# Patient Record
Sex: Female | Born: 1990 | Race: Black or African American | Hispanic: No | Marital: Single | Smoking: Current some day smoker
Health system: Southern US, Community
[De-identification: ages and names within clinical notes are randomized; demographics above are authoritative.]

## PROBLEM LIST (undated history)

## (undated) DIAGNOSIS — N946 Dysmenorrhea, unspecified: Secondary | ICD-10-CM

## (undated) DIAGNOSIS — J45909 Unspecified asthma, uncomplicated: Secondary | ICD-10-CM

## (undated) DIAGNOSIS — I10 Essential (primary) hypertension: Secondary | ICD-10-CM

## (undated) DIAGNOSIS — Z5189 Encounter for other specified aftercare: Secondary | ICD-10-CM

## (undated) DIAGNOSIS — D219 Benign neoplasm of connective and other soft tissue, unspecified: Secondary | ICD-10-CM

## (undated) DIAGNOSIS — D649 Anemia, unspecified: Secondary | ICD-10-CM

## (undated) HISTORY — DX: Essential (primary) hypertension: I10

## (undated) HISTORY — DX: Encounter for other specified aftercare: Z51.89

## (undated) HISTORY — DX: Benign neoplasm of connective and other soft tissue, unspecified: D21.9

## (undated) HISTORY — DX: Dysmenorrhea, unspecified: N94.6

## (undated) HISTORY — DX: Unspecified asthma, uncomplicated: J45.909

## (undated) HISTORY — DX: Anemia, unspecified: D64.9

---

## 2016-01-06 DIAGNOSIS — N920 Excessive and frequent menstruation with regular cycle: Secondary | ICD-10-CM | POA: Insufficient documentation

## 2016-01-25 DIAGNOSIS — D250 Submucous leiomyoma of uterus: Secondary | ICD-10-CM | POA: Insufficient documentation

## 2016-03-28 DIAGNOSIS — G44219 Episodic tension-type headache, not intractable: Secondary | ICD-10-CM | POA: Insufficient documentation

## 2016-08-08 DIAGNOSIS — N840 Polyp of corpus uteri: Secondary | ICD-10-CM | POA: Insufficient documentation

## 2016-08-08 DIAGNOSIS — D500 Iron deficiency anemia secondary to blood loss (chronic): Secondary | ICD-10-CM | POA: Insufficient documentation

## 2020-02-12 ENCOUNTER — Encounter: Payer: Self-pay | Admitting: Obstetrics and Gynecology

## 2020-02-12 ENCOUNTER — Other Ambulatory Visit (HOSPITAL_COMMUNITY)
Admission: RE | Admit: 2020-02-12 | Discharge: 2020-02-12 | Disposition: A | Discharge status: Home / Self Care | Payer: PRIVATE HEALTH INSURANCE | Source: Ambulatory Visit | Attending: Obstetrics and Gynecology | Admitting: Obstetrics and Gynecology

## 2020-02-12 ENCOUNTER — Ambulatory Visit (INDEPENDENT_AMBULATORY_CARE_PROVIDER_SITE_OTHER): Payer: PRIVATE HEALTH INSURANCE | Admitting: Obstetrics and Gynecology

## 2020-02-12 ENCOUNTER — Other Ambulatory Visit: Payer: Self-pay

## 2020-02-12 VITALS — BP 142/88 | HR 102 | Ht 63.75 in | Wt 170.0 lb

## 2020-02-12 DIAGNOSIS — Z124 Encounter for screening for malignant neoplasm of cervix: Secondary | ICD-10-CM | POA: Diagnosis present

## 2020-02-12 DIAGNOSIS — Z3169 Encounter for other general counseling and advice on procreation: Secondary | ICD-10-CM

## 2020-02-12 DIAGNOSIS — Z23 Encounter for immunization: Secondary | ICD-10-CM

## 2020-02-12 DIAGNOSIS — Z01419 Encounter for gynecological examination (general) (routine) without abnormal findings: Secondary | ICD-10-CM

## 2020-02-12 DIAGNOSIS — Z833 Family history of diabetes mellitus: Secondary | ICD-10-CM

## 2020-02-12 DIAGNOSIS — Z113 Encounter for screening for infections with a predominantly sexual mode of transmission: Secondary | ICD-10-CM | POA: Insufficient documentation

## 2020-02-12 DIAGNOSIS — Z8619 Personal history of other infectious and parasitic diseases: Secondary | ICD-10-CM

## 2020-02-12 DIAGNOSIS — Z0000 Encounter for general adult medical examination without abnormal findings: Secondary | ICD-10-CM

## 2020-02-12 NOTE — Patient Instructions (Addendum)
Semen Analysis Test Why am I having this test? A semen analysis test is done to check certain aspects of the health of a man's reproductive organs (testes) and the hormone system that plays a role in semen production. Semen is a whitish secretion that is released from the penis during the final phase of orgasm (ejaculation). It is made up of liquids and nutrients from the prostate gland, seminal vesicles, and other glands. It also contains sperm cells from the testes. A single sperm cell contains one complete set of a man's genetic coding (chromosomes). You may have this test as a part of infertility testing, which is done to help find out reasons for the inability to have a child. The test may also be done to determine whether a previously performed vasectomy was successful. A vasectomy is a procedure done to make a man permanently infertile by tying the tube (the vas deferens) that collects the sperm from the testicle. A vasectomy blocks the sperm from going through the vas deferens and penis so that the sperm will not go into the vagina during sexual intercourse. What is being tested? If the test is done as part of infertility testing, the shape (morphology), size, and movement (motility) of sperm cells will be included in the analysis. This testing may also include assessing a sperm cell's ability to penetrate an egg (fertilize) as well as the formation of genetic material (DNA). If the test is done to check the success of a vasectomy, the sample will be checked for the presence of sperm. What kind of sample is taken? A semen sample is required for this test. A semen sample will be collected by ejaculation into a sterile glass or plastic container provided by the lab. This can be done at home, in your health care provider's office, or in the lab. How do I collect samples at home?  If the sample will be collected at home, follow your health care provider's instructions about how to collect the sample.  The sample should be delivered to the lab within 1 hour after collection. It should also be protected from extreme heat or cold. How do I prepare for this test? For infertility testing: Avoid sexual activity for 2-3 days before the semen sample collection. However, do not avoid ejaculation for a prolonged period because this can alter the motility of sperm cells. For vasectomy success testing: Make sure that you ejaculate one or two times before the day of semen sample collection. This will clear the vas deferens of any sperm that were present before the vasectomy was performed. Tell a health care provider about:  All medicines you are taking, including vitamins, herbs, eye drops, creams, and over-the-counter medicines. How are the results reported? Your test results will be reported as values. Your health care provider will compare your results to normal ranges that were established after testing a large group of people (reference values). Reference values may vary among labs and hospitals. For this test, common reference values are:  Volume: 2-5 mL.  Liquefaction time: 20-30 minutes after collection.  Appearance: normal (whitish in color).  Motile/mL: greater than or equal to 10 million.  Sperm/mL: greater than or equal to 20 million.  Viscosity: greater than or equal to 3.  Agglutination: greater than or equal to 3.  Supravital: greater than or equal to 75% live.  Fructose: positive.  pH: 7.12-8.  Sperm count (density): greater than or equal to 20 million/mL.  Sperm motility: greater than or equal to 50%  at 1 hour.  Sperm morphology: greater than 30% Tyrell Antonio criteria greater than 14%) normally shaped. What do the results mean? Low sperm count, abnormal motility, or abnormal morphology of sperm cells may all cause problems with female fertility. Abnormal results can also be caused by:  Certain infectious diseases, such as inflammation of a testis (orchitis) from  mumps.  Receiving certain kinds of toxic drugs, such as chemotherapy.  Having testicles that did not develop normally in childhood. When the semen analysis test is done to check the success of a vasectomy, the presence of sperm may mean that the surgery was not successful. Your health care provider may suggest a repeat of this test. Talk with your health care provider about what your results mean. Questions to ask your health care provider Ask your health care provider, or the department that is doing the test:  When will my results be ready?  How will I get my results?  What are my treatment options?  What other tests do I need?  What are my next steps? Summary  A semen analysis test is done to check certain aspects of the health of a man's reproductive organs (testes) and the hormone system that plays a role in semen production.  You may have this test as a part of infertility testing, which is done to help find out reasons for the inability to have a child. The test may also be done to determine whether a previously performed vasectomy was successful.  A semen sample will be collected by ejaculation into a sterile glass or plastic container. Follow instructions about how to collect the sample, and make sure you deliver the sample to the lab within 1 hour of obtaining it.  Talk with your health care provider about what your results mean. This information is not intended to replace advice given to you by your health care provider. Make sure you discuss any questions you have with your health care provider. Document Revised: 02/15/2017 Document Reviewed: 02/15/2017 Elsevier Patient Education  [place]. Hysterosalpingography  Hysterosalpingography is a procedure in which a woman's uterus and fallopian tubes are examined. During this procedure, contrast dye is injected into the uterus through the vagina and cervix. X-rays are then taken. The dye makes the uterus and fallopian  tubes show up clearly on the X-rays. This procedure may be done:  To help determine whether there are tumors, scars (adhesions), or other abnormalities in the uterus.  To find out why a woman is unable to have children (infertile).  To make sure the fallopian tubes are completely blocked a few months after having certain tubal sterilization procedures. Tell a health care provider about:  Any allergies you have.  All medicines you are taking, including vitamins, herbs, eye drops, creams, and over-the-counter medicines.  Any problems you or family members have had with the use of anesthetic medicines.  Any blood disorders you have.  Any surgeries you have had.  Any medical conditions you have.  Whether you are pregnant or may be pregnant. What are the risks? Generally, this is a safe procedure. However, problems may occur, including:  Infection in the lining of the uterus (endometritis) or fallopian tubes (salpingitis).  Allergic reaction to medicines or dyes.  Risk of making a hole (perforation) in the uterus or fallopian tubes.  Damage to other structures or organs. What happens before the procedure?  Schedule the procedure after your menstrual period stops, but before your next ovulation. This is usually between day 5  and day 10 of your last period. Day 1 is the first day of your period.  Ask your health care provider about changing or stopping your regular medicines. This is especially important if you are taking diabetes medicines or blood thinners.  Empty your bladder before the procedure begins.  Plan to have someone take you home from the hospital or clinic. What happens during the procedure?  You may be given one of the following: ? A medicine to help you relax (sedative). ? An over-the-counter pain medicine.  You will lie down on your back and place your feet into footrests (stirrups).  A device called a speculum will be inserted into your vagina. This allows  your health care provider to see inside your vagina through to your cervix.  Your cervix will be washed with a germ-killing soap.  A medicine may be injected into your cervix to numb it (local anesthesia).  A thin, flexible tube will be passed through your cervix into your uterus.  Contrast dye will be passed through the tube and into the uterus. Contrast dye may cause some cramping.  Several X-rays will be taken as the contrast dye spreads through the uterus and into the fallopian tubes.  The tube will be removed. The contrast dye will flow out through your vagina naturally. The procedure may vary among health care providers and hospitals. What happens after the procedure?  Most of the contrast dye will flow out of your vagina naturally. You may want to wear a sanitary pad.  You may have mild cramping and vaginal bleeding. This should go away after a short time.  Do not drive for 24 hours if you were given a sedative.  It is up to you to get the results of your procedure. Ask your health care provider, or the department that is doing the procedure, when your results will be ready. Summary  Hysterosalpingography is a procedure in which a woman's uterus and fallopian tubes are examined.  During this procedure, contrast dye is injected into the uterus through the vagina and cervix. X-rays are then taken. The dye helps the uterus and fallopian tubes show up clearly on the X-rays.  Schedule the procedure after your menstrual period stops, but before your next ovulation. This is usually between day 5 and day 10 of your last period.  After the procedure, you may have mild cramping and vaginal bleeding. This should go away after a short time. This information is not intended to replace advice given to you by your health care provider. Make sure you discuss any questions you have with your health care provider. Document Revised: 05/04/2017 Document Reviewed: 06/14/2016 Elsevier Patient  Education  2020 [employer]. Preparing for Pregnancy If you are considering becoming pregnant, make an appointment to see your regular health care provider to learn how to prepare for a safe and healthy pregnancy (preconception care). During a preconception care visit, your health care provider will:  Do a complete physical exam, including a Pap test.  Take a complete medical history.  Give you information, answer your questions, and help you resolve problems. Preconception checklist Medical history  Tell your health care provider about any current or past medical conditions. Your pregnancy or your ability to become pregnant may be affected by chronic conditions, such as diabetes, chronic hypertension, and thyroid problems.  Include your family's medical history as well as your partner's medical history.  Tell your health care provider about any history of STIs (sexually transmitted infections).These can  affect your pregnancy. In some cases, they can be passed to your baby. Discuss any concerns that you have about STIs.  If indicated, discuss the benefits of genetic testing. This testing will show whether there are any genetic conditions that may be passed from you or your partner to your baby.  Tell your health care provider about: ? Any problems you have had with conception or pregnancy. ? Any medicines you take. These include vitamins, herbal supplements, and over-the-counter medicines. ? Your history of immunizations. Discuss any vaccinations that you may need. Diet  Ask your health care provider what to include in a healthy diet that has a balance of nutrients. This is especially important when you are pregnant or preparing to become pregnant.  Ask your health care provider to help you reach a healthy weight before pregnancy. ? If you are overweight, you may be at higher risk for certain complications, such as high blood pressure, diabetes, and preterm birth. ? If you are  underweight, you are more likely to have a baby who has a low birth weight. Lifestyle, work, and home  Let your health care provider know: ? About any lifestyle habits that you have, such as alcohol use, drug use, or smoking. ? About recreational activities that may put you at risk during pregnancy, such as downhill skiing and certain exercise programs. ? Tell your health care provider about any international travel, especially any travel to places with an active [place] virus outbreak. ? About harmful substances that you may be exposed to at work or at home. These include chemicals, pesticides, radiation, or even litter boxes. ? If you do not feel safe at home. Mental health  Tell your health care provider about: ? Any history of mental health conditions, including feelings of depression, sadness, or anxiety. ? Any medicines that you take for a mental health condition. These include herbs and supplements. Home instructions to prepare for pregnancy Lifestyle   Eat a balanced diet. This includes fresh fruits and vegetables, whole grains, lean meats, low-fat dairy products, healthy fats, and foods that are high in fiber. Ask to meet with a nutritionist or registered dietitian for assistance with meal planning and goals.  Get regular exercise. Try to be active for at least 30 minutes a day on most days of the week. Ask your health care provider which activities are safe during pregnancy.  Do not use any products that contain nicotine or tobacco, such as cigarettes and e-cigarettes. If you need help quitting, ask your health care provider.  Do not drink alcohol.  Do not take illegal drugs.  Maintain a healthy weight. Ask your health care provider what weight range is right for you. General instructions  Keep an accurate record of your menstrual periods. This makes it easier for your health care provider to determine your baby's due date.  Begin taking prenatal vitamins and folic acid  supplements daily as directed by your health care provider.  Manage any chronic conditions, such as high blood pressure and diabetes, as told by your health care provider. This is important. How do I know that I am pregnant? You may be pregnant if you have been sexually active and you miss your period. Symptoms of early pregnancy include:  Mild cramping.  Very light vaginal bleeding (spotting).  Feeling unusually tired.  Nausea and vomiting (morning sickness). If you have any of these symptoms and you suspect that you might be pregnant, you can take a home pregnancy test. These tests check  for a hormone in your urine (human chorionic gonadotropin, or hCG). A woman's body begins to make this hormone during early pregnancy. These tests are very accurate. Wait until at least the first day after you miss your period to take one. If the test shows that you are pregnant (you get a positive result), call your health care provider to make an appointment for prenatal care. What should I do if I become pregnant?      Make an appointment with your health care provider as soon as you suspect you are pregnant.  Do not use any products that contain nicotine, such as cigarettes, chewing tobacco, and e-cigarettes. If you need help quitting, ask your health care provider.  Do not drink alcoholic beverages. Alcohol is related to a number of birth defects.  Avoid toxic odors and chemicals.  You may continue to have sexual intercourse if it does not cause pain or other problems, such as vaginal bleeding. This information is not intended to replace advice given to you by your health care provider. Make sure you discuss any questions you have with your health care provider. Document Revised: 05/24/2017 Document Reviewed: 12/12/2015 Elsevier Patient Education  [place] DIET:  We recommended that you start or continue a regular exercise program for good health. Regular exercise  means any activity that makes your heart beat faster and makes you sweat.  We recommend exercising at least 30 minutes per day at least 3 days a week, preferably 4 or 5.  We also recommend a diet low in fat and sugar.  Inactivity, poor dietary choices and obesity can cause diabetes, heart attack, stroke, and kidney damage, among others.    ALCOHOL AND SMOKING:  Women should limit their alcohol intake to no more than 7 drinks/beers/glasses of wine (combined, not each!) per week. Moderation of alcohol intake to this level decreases your risk of breast cancer and liver damage. And of course, no recreational drugs are part of a healthy lifestyle.  And absolutely no smoking or even second hand smoke. Most people know smoking can cause heart and lung diseases, but did you know it also contributes to weakening of your bones? Aging of your skin?  Yellowing of your teeth and nails?  CALCIUM AND VITAMIN D:  Adequate intake of calcium and Vitamin D are recommended.  The recommendations for exact amounts of these supplements seem to change often, but generally speaking 1,000 mg of calcium (between diet and supplement) and 800 units of Vitamin D per day seems prudent. Certain women may benefit from higher intake of Vitamin D.  If you are among these women, your doctor will have told you during your visit.    PAP SMEARS:  Pap smears, to check for cervical cancer or precancers,  have traditionally been done yearly, although recent scientific advances have shown that most women can have pap smears less often.  However, every woman still should have a physical exam from her gynecologist every year. It will include a breast check, inspection of the vulva and vagina to check for abnormal growths or skin changes, a visual exam of the cervix, and then an exam to evaluate the size and shape of the uterus and ovaries.  And after 30 years of age, a rectal exam is indicated to check for rectal cancers. We will also provide age  appropriate advice regarding health maintenance, like when you should have certain vaccines, screening for sexually transmitted diseases, bone density testing, colonoscopy, mammograms,  etc.   MAMMOGRAMS:  All women over 58 years old should have a yearly mammogram. Many facilities now offer a "3D" mammogram, which may cost around $50 extra out of pocket. If possible,  we recommend you accept the option to have the 3D mammogram performed.  It both reduces the number of women who will be called back for extra views which then turn out to be normal, and it is better than the routine mammogram at detecting truly abnormal areas.    COLON CANCER SCREENING: Now recommend starting at age 65. At this time colonoscopy is not covered for routine screening until 50. There are take home tests that can be done between 45-49.   COLONOSCOPY:  Colonoscopy to screen for colon cancer is recommended for all women at age 64.  We know, you hate the idea of the prep.  We agree, BUT, having colon cancer and not knowing it is worse!!  Colon cancer so often starts as a polyp that can be seen and removed at colonscopy, which can quite literally save your life!  And if your first colonoscopy is normal and you have no family history of colon cancer, most women don't have to have it again for 10 years.  Once every ten years, you can do something that may end up saving your life, right?  We will be happy to help you get it scheduled when you are ready.  Be sure to check your insurance coverage so you understand how much it will cost.  It may be covered as a preventative service at no cost, but you should check your particular policy.      Breast Self-Awareness Breast self-awareness means being familiar with how your breasts look and feel. It involves checking your breasts regularly and reporting any changes to your health care provider. Practicing breast self-awareness is important. A change in your breasts can be a sign of a serious  medical problem. Being familiar with how your breasts look and feel allows you to find any problems early, when treatment is more likely to be successful. All women should practice breast self-awareness, including women who have had breast implants. How to do a breast self-exam One way to learn what is normal for your breasts and whether your breasts are changing is to do a breast self-exam. To do a breast self-exam: Look for Changes  1. Remove all the clothing above your waist. 2. Stand in front of a mirror in a room with good lighting. 3. Put your hands on your hips. 4. Push your hands firmly downward. 5. Compare your breasts in the mirror. Look for differences between them (asymmetry), such as: ? Differences in shape. ? Differences in size. ? Puckers, dips, and bumps in one breast and not the other. 6. Look at each breast for changes in your skin, such as: ? Redness. ? Scaly areas. 7. Look for changes in your nipples, such as: ? Discharge. ? Bleeding. ? Dimpling. ? Redness. ? A change in position. Feel for Changes Carefully feel your breasts for lumps and changes. It is best to do this while lying on your back on the floor and again while sitting or standing in the shower or tub with soapy water on your skin. Feel each breast in the following way:  Place the arm on the side of the breast you are examining above your head.  Feel your breast with the other hand.  Start in the nipple area and make  inch (2 cm) overlapping  circles to feel your breast. Use the pads of your three middle fingers to do this. Apply light pressure, then medium pressure, then firm pressure. The light pressure will allow you to feel the tissue closest to the skin. The medium pressure will allow you to feel the tissue that is a little deeper. The firm pressure will allow you to feel the tissue close to the ribs.  Continue the overlapping circles, moving downward over the breast until you feel your ribs below  your breast.  Move one finger-width toward the center of the body. Continue to use the  inch (2 cm) overlapping circles to feel your breast as you move slowly up toward your collarbone.  Continue the up and down exam using all three pressures until you reach your armpit.  Write Down What You Find  Write down what is normal for each breast and any changes that you find. Keep a written record with breast changes or normal findings for each breast. By writing this information down, you do not need to depend only on memory for size, tenderness, or location. Write down where you are in your menstrual cycle, if you are still menstruating. If you are having trouble noticing differences in your breasts, do not get discouraged. With time you will become more familiar with the variations in your breasts and more comfortable with the exam. How often should I examine my breasts? Examine your breasts every month. If you are breastfeeding, the best time to examine your breasts is after a feeding or after using a breast pump. If you menstruate, the best time to examine your breasts is 5-7 days after your period is over. During your period, your breasts are lumpier, and it may be more difficult to notice changes. When should I see my health care provider? See your health care provider if you notice:  A change in shape or size of your breasts or nipples.  A change in the skin of your breast or nipples, such as a reddened or scaly area.  Unusual discharge from your nipples.  A lump or thick area that was not there before.  Pain in your breasts.  Anything that concerns you.     Hysterosalpingography  Hysterosalpingography is a procedure in which a woman's uterus and fallopian tubes are examined. During this procedure, contrast dye is injected into the uterus through the vagina and cervix. X-rays are then taken. The dye makes the uterus and fallopian tubes show up clearly on the X-rays. This procedure  may be done:  To help determine whether there are tumors, scars (adhesions), or other abnormalities in the uterus.  To find out why a woman is unable to have children (infertile).  To make sure the fallopian tubes are completely blocked a few months after having certain tubal sterilization procedures. Tell a health care provider about:  Any allergies you have.  All medicines you are taking, including vitamins, herbs, eye drops, creams, and over-the-counter medicines.  Any problems you or family members have had with the use of anesthetic medicines.  Any blood disorders you have.  Any surgeries you have had.  Any medical conditions you have.  Whether you are pregnant or may be pregnant. What are the risks? Generally, this is a safe procedure. However, problems may occur, including:  Infection in the lining of the uterus (endometritis) or fallopian tubes (salpingitis).  Allergic reaction to medicines or dyes.  Risk of making a hole (perforation) in the uterus or fallopian tubes.  Damage  to other structures or organs. What happens before the procedure?  Schedule the procedure after your menstrual period stops, but before your next ovulation. This is usually between day 5 and day 10 of your last period. Day 1 is the first day of your period.  Ask your health care provider about changing or stopping your regular medicines. This is especially important if you are taking diabetes medicines or blood thinners.  Empty your bladder before the procedure begins.  Plan to have someone take you home from the hospital or clinic. What happens during the procedure?  You may be given one of the following: ? A medicine to help you relax (sedative). ? An over-the-counter pain medicine.  You will lie down on your back and place your feet into footrests (stirrups).  A device called a speculum will be inserted into your vagina. This allows your health care provider to see inside your vagina  through to your cervix.  Your cervix will be washed with a germ-killing soap.  A medicine may be injected into your cervix to numb it (local anesthesia).  A thin, flexible tube will be passed through your cervix into your uterus.  Contrast dye will be passed through the tube and into the uterus. Contrast dye may cause some cramping.  Several X-rays will be taken as the contrast dye spreads through the uterus and into the fallopian tubes.  The tube will be removed. The contrast dye will flow out through your vagina naturally. The procedure may vary among health care providers and hospitals. What happens after the procedure?  Most of the contrast dye will flow out of your vagina naturally. You may want to wear a sanitary pad.  You may have mild cramping and vaginal bleeding. This should go away after a short time.  Do not drive for 24 hours if you were given a sedative.  It is up to you to get the results of your procedure. Ask your health care provider, or the department that is doing the procedure, when your results will be ready. Summary  Hysterosalpingography is a procedure in which a woman's uterus and fallopian tubes are examined.  During this procedure, contrast dye is injected into the uterus through the vagina and cervix. X-rays are then taken. The dye helps the uterus and fallopian tubes show up clearly on the X-rays.  Schedule the procedure after your menstrual period stops, but before your next ovulation. This is usually between day 5 and day 10 of your last period.  After the procedure, you may have mild cramping and vaginal bleeding. This should go away after a short time. This information is not intended to replace advice given to you by your health care provider. Make sure you discuss any questions you have with your health care provider. Document Revised: 05/04/2017 Document Reviewed: 06/14/2016 Elsevier Patient Education  [place].  Breast  Tenderness Breast tenderness is a common problem for women of all ages, but may also occur in men. Breast tenderness may range from mild discomfort to severe pain. In women, the pain usually comes and goes with the menstrual cycle, but it can also be constant. Breast tenderness has many possible causes, including hormone changes, infections, and taking certain medicines. You may have tests, such as a mammogram or an ultrasound, to check for any unusual findings. Having breast tenderness usually does not mean that you have breast cancer. Follow these instructions at home: Managing pain and discomfort   If directed, put ice to  the painful area. To do this: ? Put ice in a plastic bag. ? Place a towel between your skin and the bag. ? Leave the ice on for 20 minutes, 2-3 times a day.  Wear a supportive bra, especially during exercise. You may also want to wear a supportive bra while sleeping if your breasts are very tender. Medicines  Take over-the-counter and prescription medicines only as told by your health care provider. If the cause of your pain is infection, you may be prescribed an antibiotic medicine.  If you were prescribed an antibiotic, take it as told by your health care provider. Do not stop taking the antibiotic even if you start to feel better. Eating and drinking  Your health care provider may recommend that you lessen the amount of fat in your diet. You can do this by: ? Limiting fried foods. ? Cooking foods using methods such as baking, boiling, grilling, and broiling.  Decrease the amount of caffeine in your diet. Instead, drink more water and choose caffeine-free drinks. General instructions   Keep a log of the days and times when your breasts are most tender.  Ask your health care provider how to do breast exams at home. This will help you notice if you have an unusual growth or lump.  Keep all follow-up visits as told by your health care provider. This is  important. Contact a health care provider if:  Any part of your breast is hard, red, and hot to the touch. This may be a sign of infection.  You are a woman and: ? Not breastfeeding and you have fluid, especially blood or pus, coming out of your nipples. ? Have a new or painful lump in your breast that remains after your menstrual period ends.  You have a fever.  Your pain does not improve or it gets worse.  Your pain is interfering with your daily activities. Summary  Breast tenderness may range from mild discomfort to severe pain.  Breast tenderness has many possible causes, including hormone changes, infections, and taking certain medicines.  It can be treated with ice, wearing a supportive bra, and medicines.  Make changes to your diet if told to by your health care provider. This information is not intended to replace advice given to you by your health care provider. Make sure you discuss any questions you have with your health care provider. Document Revised: 10/14/2018 Document Reviewed: 10/14/2018 Elsevier Patient Education  [place].

## 2020-02-12 NOTE — Progress Notes (Signed)
29 y.o. No obstetric history on file. Single Black or African American Not Hispanic or Latino female here for annual exam. She had surgery in 2018 to remove fibroids, hysteroscopic myomectomy. She was having such heavy bleeding that she had to have a blood transfusion. Cycles have been fine since then. Patient states that her right breast is sore and tender for the last week. She states that she is trying to get pregnant, has been trying to get pregnant since 10/20. Having sex every 1-2 days if she isn't on her cycle.  She has h/o chlamydia.   She has a h/o HTN, recently her primary changed her from lisinopril to norvasc secondary to trying to conceive.   Period Cycle (Days): 25 Period Duration (Days): 6 Period Pattern: Regular Menstrual Flow: Moderate Menstrual Control: Tampon, Maxi pad Menstrual Control Change Freq (Hours): 2-3 Dysmenorrhea: (!) Mild Dysmenorrhea Symptoms: Nausea  Patient's last menstrual period was 02/08/2020 (exact date).          Sexually active:  Yes  The current method of family planning is none.    Exercising: No.  The patient does not participate in regular exercise at present. Smoker:  Yes, 1 cigarette in the evening.   Health Maintenance: Pap:  Aug 2020 Normal  History of abnormal Pap:  no MMG:  Never  BMD:   Never Colonoscopy: Never TDaP:  Unsure  Gardasil: complete  She had chicken pox as a kid.   reports that she has been smoking cigarettes. She has never used smokeless tobacco. She reports that she does not use drugs. She and her partner just moved here from [place].   Past Medical History:  Diagnosis Date  . Anemia   . Blood transfusion without reported diagnosis   . Dysmenorrhea   . Fibroid     Current Outpatient Medications  Medication Sig Dispense Refill  . amLODipine (NORVASC) 10 MG tablet Take 1 tablet by mouth daily.    . ferrous sulfate 325 (65 FE) MG tablet Take by mouth.     No current facility-administered medications for  this visit.    Family History  Problem Relation Age of Onset  . Diabetes Mother   . Hypertension Mother   . Diabetes Maternal Aunt   . Seizures Maternal Aunt   . Thyroid disease Maternal Aunt   . Osteoarthritis Maternal Grandmother   . Diabetes Maternal Grandmother   . Heart failure Maternal Grandmother   . Hypertension Maternal Grandmother   . Seizures Maternal Grandmother     Review of Systems  Genitourinary:       Breast pain     Exam:   BP (!) 160/100   Pulse (!) 102   Ht 5' 3.75" (1.619 m)   Wt 170 lb (77.1 kg)   LMP 02/08/2020 (Exact Date)   SpO2 99%   BMI 29.41 kg/m   Weight change: @WEIGHTCHANGE @ Height:   Height: 5' 3.75" (161.9 cm)  Ht Readings from Last 3 Encounters:  02/12/20 5' 3.75" (1.619 m)    General appearance: alert, cooperative and appears stated age Head: Normocephalic, without obvious abnormality, atraumatic Neck: no adenopathy, supple, symmetrical, trachea midline and thyroid normal to inspection and palpation Lungs: clear to auscultation bilaterally Cardiovascular: regular rate and rhythm Breasts: normal appearance, no masses or tenderness Abdomen: soft, non-tender; non distended,  no masses,  no organomegaly Extremities: extremities normal, atraumatic, no cyanosis or edema Skin: Skin color, texture, turgor normal. No rashes or lesions Lymph nodes: Cervical, supraclavicular, and axillary nodes normal. No  abnormal inguinal nodes palpated Neurologic: Grossly normal   Pelvic: External genitalia:  no lesions              Urethra:  normal appearing urethra with no masses, tenderness or lesions              Bartholins and Skenes: normal                 Vagina: normal appearing vagina with normal color and discharge, no lesions              Cervix: no lesions               Bimanual Exam:  Uterus:  anteverted, mobile, mildly tender, not appreciably enlarged              Adnexa: no mass, fullness, tenderness               Rectovaginal:  Confirms               Anus:  normal sphincter tone, no lesions  Gae Dry chaperoned for the exam.  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   H/O chlamydia  H/O HTN, elevated BP today  Focal breast tenderness in the last week  P:   Get Semen analysis, paperwork given for her partner to sign  HSG, she will call on the first day of her cycle  Cycle day #3 FSH, estradiol, call with the start of her cycle  Screening labs, Rubella titer  TDAP today  Start PNV  Discussed managing breast tenderness, if symptoms don't improve in the next few weeks she will call  Recheck BP 142/88  Name of primary given, she will discuss BP medication there

## 2020-02-13 ENCOUNTER — Encounter: Payer: Self-pay | Admitting: Obstetrics and Gynecology

## 2020-02-13 LAB — CBC
Hematocrit: 43.5 % (ref 34.0–46.6)
Hemoglobin: 14.5 g/dL (ref 11.1–15.9)
MCH: 28.4 pg (ref 26.6–33.0)
MCHC: 33.3 g/dL (ref 31.5–35.7)
MCV: 85 fL (ref 79–97)
Platelets: 217 10*3/uL (ref 150–450)
RBC: 5.11 x10E6/uL (ref 3.77–5.28)
RDW: 12.9 % (ref 11.7–15.4)
WBC: 4.4 10*3/uL (ref 3.4–10.8)

## 2020-02-13 LAB — COMPREHENSIVE METABOLIC PANEL
ALT: 65 IU/L — ABNORMAL HIGH (ref 0–32)
AST: 46 IU/L — ABNORMAL HIGH (ref 0–40)
Albumin/Globulin Ratio: 1.2 (ref 1.2–2.2)
Albumin: 4.8 g/dL (ref 3.9–5.0)
Alkaline Phosphatase: 85 IU/L (ref 48–121)
BUN/Creatinine Ratio: 17 (ref 9–23)
BUN: 12 mg/dL (ref 6–20)
Bilirubin Total: 0.5 mg/dL (ref 0.0–1.2)
CO2: 23 mmol/L (ref 20–29)
Calcium: 10.1 mg/dL (ref 8.7–10.2)
Chloride: 98 mmol/L (ref 96–106)
Creatinine, Ser: 0.71 mg/dL (ref 0.57–1.00)
GFR calc Af Amer: 132 mL/min/{1.73_m2} (ref >59)
GFR calc non Af Amer: 115 mL/min/{1.73_m2} (ref >59)
Globulin, Total: 4 g/dL (ref 1.5–4.5)
Glucose: 83 mg/dL (ref 65–99)
Potassium: 3.3 mmol/L — ABNORMAL LOW (ref 3.5–5.2)
Sodium: 137 mmol/L (ref 134–144)
Total Protein: 8.8 g/dL — ABNORMAL HIGH (ref 6.0–8.5)

## 2020-02-13 LAB — HEP, RPR, HIV PANEL
HIV Screen 4th Generation wRfx: NONREACTIVE
Hepatitis B Surface Ag: NEGATIVE
RPR Ser Ql: NONREACTIVE

## 2020-02-13 LAB — HEPATITIS C ANTIBODY: Hep C Virus Ab: <0.1 s/co ratio (ref 0.0–0.9)

## 2020-02-13 LAB — LIPID PANEL
Chol/HDL Ratio: 4.1 ratio (ref 0.0–4.4)
Cholesterol, Total: 231 mg/dL — ABNORMAL HIGH (ref 100–199)
HDL: 57 mg/dL (ref >39)
LDL Chol Calc (NIH): 152 mg/dL — ABNORMAL HIGH (ref 0–99)
Triglycerides: 122 mg/dL (ref 0–149)
VLDL Cholesterol Cal: 22 mg/dL (ref 5–40)

## 2020-02-13 LAB — HEMOGLOBIN A1C
Est. average glucose Bld gHb Est-mCnc: 100 mg/dL
Hgb A1c MFr Bld: 5.1 % (ref 4.8–5.6)

## 2020-02-13 LAB — RUBELLA SCREEN: Rubella Antibodies, IGG: 2.52 index (ref >0.99)

## 2020-02-16 ENCOUNTER — Encounter: Payer: Self-pay | Admitting: Obstetrics and Gynecology

## 2020-02-16 LAB — CYTOLOGY - PAP
Chlamydia: NEGATIVE
Comment: NEGATIVE
Comment: NEGATIVE
Comment: NEGATIVE
Comment: NORMAL
Diagnosis: NEGATIVE
High risk HPV: NEGATIVE
Neisseria Gonorrhea: NEGATIVE
Trichomonas: NEGATIVE

## 2020-02-17 ENCOUNTER — Encounter: Payer: Self-pay | Admitting: Obstetrics and Gynecology

## 2020-02-17 ENCOUNTER — Telehealth: Payer: Self-pay

## 2020-02-17 NOTE — Telephone Encounter (Signed)
Pt sent following mychart message:   [place], [facility] What's does that mean when it say transformation zone present on my pap results

## 2020-02-17 NOTE — Telephone Encounter (Signed)
Pap results from 02/12/20.   Routing to Dr Talbert Nan for review and recommendations. Please advise.

## 2020-02-18 ENCOUNTER — Encounter: Payer: Self-pay | Admitting: Obstetrics and Gynecology

## 2020-02-18 NOTE — Telephone Encounter (Signed)
Spoke with Elizabeth Morales. Elizabeth Morales verbalized understanding of lab results. Elizabeth Morales wanting to know next steps for infertility.  Per reviewed notes, Get Semen analysis, paperwork given for her partner to sign             HSG, she will call on the first day of her cycle             Cycle day #3 [place], estradiol, call with the start of her cycle  Elizabeth Morales agreeable to plan of care. Will call first day of cycle to have HSG and labs. Elizabeth Morales states will send Semen Analysis consent through mychart to Dr Talbert Nan.  Encounter closed.

## 2020-02-18 NOTE — Telephone Encounter (Signed)
Patient is returning call to [facility] about results.

## 2020-02-18 NOTE — Telephone Encounter (Signed)
Reviewed with Dr Talbert Nan. Normal/negative results.  Left pt detailed message per DPR. Pt to return call with any questions or concerns.  Encounter closed.

## 2020-03-08 ENCOUNTER — Encounter: Payer: Self-pay | Admitting: Obstetrics and Gynecology

## 2020-03-08 ENCOUNTER — Other Ambulatory Visit: Payer: Self-pay | Admitting: Obstetrics and Gynecology

## 2020-03-08 ENCOUNTER — Telehealth: Payer: Self-pay

## 2020-03-08 DIAGNOSIS — N970 Female infertility associated with anovulation: Secondary | ICD-10-CM

## 2020-03-08 DIAGNOSIS — N979 Female infertility, unspecified: Secondary | ICD-10-CM

## 2020-03-08 NOTE — Telephone Encounter (Signed)
Pt sent following mychart message:   [place], [place] to Salvadore Dom, MD   MM  03/08/20 9:06 AM Dr Talbert Nan wanted me to call on the first day of my cycle to make an appointment. Should I call the front or will you make it

## 2020-03-08 NOTE — Telephone Encounter (Signed)
Reviewed with Dr Talbert Nan. Pt ok to have labs on # 3 day of cycle and have HSG scheduled 7-10 day of cycle at [facility].   Spoke with pt. Pt agreeable to labs and HSG. Pt scheduled for labs on 10/5 at 345 pm. Pt verbalized understanding.  Advised pt will call [facility] and make appt for Monday 10/11. Pt requesting after 330 pm. Pt advised will return call when appt made. Pt agreeable.

## 2020-03-08 NOTE — Telephone Encounter (Signed)
Spoke with pt. Pt given update for HSG appt on 10/11 at 2 pm. Pt states cannot go at this time. Pt advised to call back to [facility] and reschedule time, number given. Pt agreeable.   Routing to Dr Talbert Nan for update and review.  Encounter closed.  Orders placed

## 2020-03-08 NOTE — Telephone Encounter (Signed)
AEX 02/12/20 Picked up semen analysis kit on 9/28 and has not completed yet. H/o primary infertility.   Spoke with pt. Pt states starting her cycle last night 10/3. Pt states was told to call when started cycle.  Per reviewed notes on 9/9: HSG, she will call on the first day of her cycle             Cycle day #3 FSH, estradiol, call with the start of her cycle  Advised pt will review with Dr Talbert Nan and return call with plan of care. Pt agreeable.   Routing to Dr Talbert Nan

## 2020-03-08 NOTE — Telephone Encounter (Signed)
Spoke with Danae Chen at [employer]. Pt scheduled for Monday 10/11 at 2pm, arrive at 145 pm.

## 2020-03-09 ENCOUNTER — Other Ambulatory Visit (INDEPENDENT_AMBULATORY_CARE_PROVIDER_SITE_OTHER): Payer: PRIVATE HEALTH INSURANCE

## 2020-03-09 ENCOUNTER — Other Ambulatory Visit: Payer: Self-pay | Admitting: *Deleted

## 2020-03-09 ENCOUNTER — Other Ambulatory Visit: Payer: PRIVATE HEALTH INSURANCE

## 2020-03-09 ENCOUNTER — Encounter: Payer: Self-pay | Admitting: Obstetrics and Gynecology

## 2020-03-09 ENCOUNTER — Other Ambulatory Visit: Payer: Self-pay

## 2020-03-09 DIAGNOSIS — N979 Female infertility, unspecified: Secondary | ICD-10-CM

## 2020-03-10 ENCOUNTER — Other Ambulatory Visit: Payer: Self-pay | Admitting: Physician Assistant

## 2020-03-10 ENCOUNTER — Ambulatory Visit (INDEPENDENT_AMBULATORY_CARE_PROVIDER_SITE_OTHER): Payer: PRIVATE HEALTH INSURANCE | Admitting: Physician Assistant

## 2020-03-10 ENCOUNTER — Encounter: Payer: Self-pay | Admitting: Physician Assistant

## 2020-03-10 VITALS — BP 200/150 | HR 80 | Temp 98.0°F | Ht 63.0 in | Wt 171.0 lb

## 2020-03-10 DIAGNOSIS — R079 Chest pain, unspecified: Secondary | ICD-10-CM

## 2020-03-10 DIAGNOSIS — Z3169 Encounter for other general counseling and advice on procreation: Secondary | ICD-10-CM

## 2020-03-10 DIAGNOSIS — I10 Essential (primary) hypertension: Secondary | ICD-10-CM | POA: Diagnosis not present

## 2020-03-10 LAB — ESTRADIOL: Estradiol: 22.3 pg/mL

## 2020-03-10 LAB — FOLLICLE STIMULATING HORMONE: FSH: 6.4 m[IU]/mL

## 2020-03-10 NOTE — Progress Notes (Signed)
Elizabeth Morales is a 29 y.o. female is here to establish care.  I acted as a Education administrator for [employer], PA-C Anselmo Pickler, LPN   History of Present Illness:   Chief Complaint  Patient presents with  . Establish Care  . Hypertension    HPI   Pt is here to establish care today and discuss blood pressure medication. She moved from [place] about 1-2 months ago.  In 2010, approx at age 70 she was admitted to the hospital for a few days for new onset-HTN. She was given lisinopril and stopped this on her own after a few months because she heard that this medication was not good for her to take?  Several years later she went to her doctor for headaches. Was placed on Amlodopine, eventually increased to 5 mg daily. She states that up until a week ago (when she completely ran out of medication) she was taking this medication approximately two days per week.  Does not check blood pressure. Pt denies headaches, dizziness, blurred vision, SOB or lower leg edema.   Has been having chest pain off and on x 1 month. Denies any current chest pain or any chest pain history that worsens with activity. She states that her chest pain is mostly when she lays down and is reproducible with touch. She denies excessive caffeine intake, stimulant usage, excessive alcohol intake or increase in salt consumption.  Smokes 1-2 cigarettes daily, states that she only smokes when she has a BM.  She has been evaluated by her gynecologist on 02/12/20 for primary infertility. Had labs done by her at that time, I have reviewed them.   Health Maintenance Due  Topic Date Due  . COVID-19 Vaccine (1) Never done    Past Medical History:  Diagnosis Date  . Anemia   . Asthma   . Blood transfusion without reported diagnosis   . Dysmenorrhea   . Fibroid   . Hypertension      Social History   Tobacco Use  . Smoking status: Current Some Day Smoker    Types: Cigarettes  . Smokeless tobacco: Never Used    Vaping Use  . Vaping Use: Never used  Substance Use Topics  . Alcohol use: Yes    Alcohol/week: 4.0 standard drinks    Types: 2 Glasses of wine, 2 Shots of liquor per week    Comment: occ  . Drug use: Not Currently    Types: Marijuana    History reviewed. No pertinent surgical history.  Family History  Problem Relation Age of Onset  . Diabetes Mother   . Hypertension Mother   . Diabetes Maternal Aunt   . Seizures Maternal Aunt   . Thyroid disease Maternal Aunt   . Osteoarthritis Maternal Grandmother   . Diabetes Maternal Grandmother   . Heart failure Maternal Grandmother   . Hypertension Maternal Grandmother   . Seizures Maternal Grandmother   . Heart attack Maternal Grandmother   . Kidney disease Maternal Grandmother   . Stroke Maternal Uncle     PMHx, SurgHx, SocialHx, FamHx, Medications, and Allergies were reviewed in the Visit Navigator and updated as appropriate.   Patient Active Problem List   Diagnosis Date Noted  . Endometrial polyp 08/08/2016  . Iron deficiency anemia due to chronic blood loss 08/08/2016  . Episodic tension-type headache, not intractable 03/28/2016  . 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 Menorrhagia 01/06/2016    Social History   Tobacco Use  . Smoking status: Current Some  Day Smoker    Types: Cigarettes  . Smokeless tobacco: Never Used  Vaping Use  . Vaping Use: Never used  Substance Use Topics  . Alcohol use: Yes    Alcohol/week: 4.0 standard drinks    Types: 2 Glasses of wine, 2 Shots of liquor per week    Comment: occ  . Drug use: Not Currently    Types: Marijuana    Current Medications and Allergies:    Current Outpatient Medications:  .  amLODipine (NORVASC) 10 MG tablet, Take 1 tablet by mouth daily. (Patient not taking: Reported on 03/10/2020), Disp: , Rfl:   No Known Allergies  Review of Systems   ROS Negative unless otherwise specified per HPI.  Vitals:   Vitals:   03/10/20 0924 03/10/20 1009  BP:  (!) 208/150 (!) 200/150  Pulse: 95 80  Temp: 98 F (36.7 C)   TempSrc: Temporal   SpO2: 98%   Weight: 171 lb (77.6 kg)   Height: 5\' 3"  (1.6 m)      Body mass index is 30.29 kg/m.   Physical Exam:    Physical Exam Vitals and nursing note reviewed.  Constitutional:      General: She is not in acute distress.    Appearance: She is well-developed. She is not ill-appearing or toxic-appearing.  Cardiovascular:     Rate and Rhythm: Normal rate and regular rhythm.     Pulses: Normal pulses.     Heart sounds: Normal heart sounds, S1 normal and S2 normal.     Comments: No LE edema Pulmonary:     Effort: Pulmonary effort is normal.     Breath sounds: Normal breath sounds.  Skin:    General: Skin is warm and dry.  Neurological:     Mental Status: She is alert.     GCS: GCS eye subscore is 4. GCS verbal subscore is 5. GCS motor subscore is 6.  Psychiatric:        Speech: Speech normal.        Behavior: Behavior normal. Behavior is cooperative.      Assessment and Plan:    Markiah was seen today for establish care and hypertension.  Diagnoses and all orders for this visit:  Chest pain, unspecified type; Primary hypertension Currently asymptomatic. EKG tracing is personally reviewed.  EKG notes NSR.  No acute changes.  Urgent referral to cardiology. Very strict precautions to go to the ER if chest pain returns, or develops SOB, severe HA or other concerning symptoms. Restart 10 mg amlodipine. She is aware that this medication is not safe for pregnancy, and she is also aware that her current blood pressure level is also unsafe for pregnancy.   -     EKG 12-Lead -     Ambulatory referral to Cardiology -     Basic metabolic panel -     TSH -     Ambulatory referral to Cardiology -     AMB referral to Maternal Fetal Medicine (MFM)  Infertility counseling She is agreeable to counseling at MFM.  -     AMB referral to Maternal Fetal Medicine (MFM)  CMA or LPN served as  scribe during this visit. History, Physical, and Plan performed by medical provider. The above documentation has been reviewed and is accurate and complete.  Inda Coke, PA-C [place], [place] 03/10/2020  Follow-up: No follow-ups on file.

## 2020-03-10 NOTE — Patient Instructions (Addendum)
It was great to see you!  1. Resume norvasc. TAKE THIS MEDICATION EVERY SINGLE DAY. Keep track of your blood pressure for the cardiologist to review. Check daily and put on sheet for the cardiologist to review.  2. You will be contacted about your referral to cardiology and maternal fetal medicine.  3. We will update a few labs today   4. IF ANY RECURRENCE OF CHEST PAIN, SHORTNESS OF BREATH, OR SEVERE HEADACHE -- GO TO THE EMERGENCY ROOM  Follow-up with me after all of your visits.  Take care,  Inda Coke PA-C

## 2020-03-11 LAB — BASIC METABOLIC PANEL WITH GFR
BUN: 9 mg/dL (ref 7–25)
CO2: 25 mmol/L (ref 20–32)
Calcium: 9.8 mg/dL (ref 8.6–10.2)
Chloride: 104 mmol/L (ref 98–110)
Creat: 0.74 mg/dL (ref 0.50–1.10)
GFR, Est African American: 126 mL/min/{1.73_m2} (ref >=60)
GFR, Est Non African American: 109 mL/min/{1.73_m2} (ref >=60)
Glucose, Bld: 90 mg/dL (ref 65–99)
Potassium: 4.1 mmol/L (ref 3.5–5.3)
Sodium: 138 mmol/L (ref 135–146)

## 2020-03-11 LAB — HOUSE ACCOUNT TRACKING

## 2020-03-11 LAB — TSH: TSH: 1.68 mIU/L

## 2020-03-11 LAB — DUPLICATE REPORT

## 2020-03-15 ENCOUNTER — Other Ambulatory Visit: Payer: PRIVATE HEALTH INSURANCE

## 2020-03-22 ENCOUNTER — Encounter: Payer: Self-pay | Admitting: Physician Assistant

## 2020-03-22 ENCOUNTER — Encounter: Payer: Self-pay | Admitting: Internal Medicine

## 2020-03-22 ENCOUNTER — Ambulatory Visit (INDEPENDENT_AMBULATORY_CARE_PROVIDER_SITE_OTHER): Payer: PRIVATE HEALTH INSURANCE | Admitting: Internal Medicine

## 2020-03-22 ENCOUNTER — Other Ambulatory Visit: Payer: Self-pay

## 2020-03-22 VITALS — BP 166/112 | HR 80 | Temp 97.9°F | Ht 63.0 in | Wt 169.0 lb

## 2020-03-22 DIAGNOSIS — R079 Chest pain, unspecified: Secondary | ICD-10-CM

## 2020-03-22 DIAGNOSIS — Z79899 Other long term (current) drug therapy: Secondary | ICD-10-CM

## 2020-03-22 DIAGNOSIS — I10 Essential (primary) hypertension: Secondary | ICD-10-CM | POA: Diagnosis not present

## 2020-03-22 MED ORDER — LABETALOL HCL 100 MG PO TABS: 100.0000 mg | ORAL_TABLET | Freq: Two times a day (BID) | ORAL | 3 refills | Status: DC

## 2020-03-22 MED ORDER — AMLODIPINE BESYLATE 10 MG PO TABS: 10.0000 mg | ORAL_TABLET | Freq: Every day | ORAL | 0 refills | Status: AC

## 2020-03-22 NOTE — Patient Instructions (Addendum)
Medication Instructions:  START LABETALOL 100mg  TWICE DAILY *If you need a refill on your cardiac medications before your next appointment, please call your pharmacy*  Lab Work: None Ordered At This Time.  If you have labs (blood work) drawn today and your tests are completely normal, you will receive your results only by: Marland Kitchen MyChart Message (if you have MyChart) OR . A paper copy in the mail If you have any lab test that is abnormal or we need to change your treatment, we will call you to review the results.  Testing/Procedures: Your physician has requested that you have an echocardiogram. Echocardiography is a painless test that uses sound waves to create images of your heart. It provides your doctor with information about the size and shape of your heart and how well your heart's chambers and valves are working. You may receive an ultrasound enhancing agent through an IV if needed to better visualize your heart during the echo.This procedure takes approximately one hour. There are no restrictions for this procedure. This will take place at the [address].   Your physician has requested that you have a renal artery duplex. During this test, an ultrasound is used to evaluate blood flow to the kidneys. Allow one hour for this exam. Do not eat after midnight the day before and avoid carbonated beverages. Take your medications as you usually do.  YOU ARE BEING REFERRED TO [facility] ([facility]) FOR HYPERTENSION CLINIC. PLEASE FOLLOW UP WITH THEM IN 1 MONTH.   Follow-Up: At [facility], you and your health needs are our priority.  As part of our continuing mission to provide you with exceptional heart care, we have created designated Provider Care Teams.  These Care Teams include your primary Cardiologist (physician) and Advanced Practice Providers (APPs -  Physician Assistants and Nurse Practitioners) who all work together to provide you with the care you need, when you need it.  Your  next appointment:   3 month(s)  The format for your next appointment:   In Person  Provider:   Cherlynn Kaiser, MD

## 2020-03-22 NOTE — Progress Notes (Signed)
Cardiology Office Note:    Date:  03/22/2020   ID:  Elizabeth Morales, DOB 1991/04/11, MRN [id number]  PCP:  Inda Coke, PA  Cardiologist:  No primary care provider on file.  Electrophysiologist:  None   Referring MD: Inda Coke, PA   Chief Complaint/Reason for Referral: Chest pain and hypertension  History of Present Illness:    Elizabeth Morales is a 29 y.o. female with a history of hypertension and infertility seeking pregnancy, who presents for chest pain.   She has longstanding hypertension. Previously received lisinopril but she stopped it after reading about side effects. She has been on amlodipine 10 mg daily but feels it doesn't help her BP. Unclear if she has undergone a secondary workup for hypertension yet.   She has both chest pain and headaches which seem to correlate with HTN episodes. She does not check her BP at home.   At times, such as today, she forgets to take her medication - and BP is elevated today as a result.   Active smoking.   She is attempting to get pregnant and is actively seeking fertility workup. We discussed adjusting medication therapy with a though that she may get pregnant at any time. No prior pregnancies, therefore no GHTN or GDM.  Past Medical History:  Diagnosis Date  . Anemia   . Asthma   . Blood transfusion without reported diagnosis   . Dysmenorrhea   . Fibroid   . Hypertension     No past surgical history on file.  Current Medications: Current Meds  Medication Sig  .  amLODipine (NORVASC) 10 MG tablet Take 1 tablet by mouth daily.      Allergies:   Patient has no known allergies.   Social History   Tobacco Use  . Smoking status: Current Some Day Smoker    Types: Cigarettes  . Smokeless tobacco: Never Used  Vaping Use  . Vaping Use: Never used  Substance Use Topics  . Alcohol use: Yes    Alcohol/week: 4.0 standard drinks    Types: 2 Glasses of wine, 2 Shots of liquor per week    Comment: occ  . Drug use:  Not Currently    Types: Marijuana     Family History: The patient's family history includes Diabetes in her maternal aunt, maternal grandmother, and mother; Heart attack in her maternal grandmother; Heart failure in her maternal grandmother; Hypertension in her maternal grandmother and mother; Kidney disease in her maternal grandmother; Osteoarthritis in her maternal grandmother; Seizures in her maternal aunt and maternal grandmother; Stroke in her maternal uncle; Thyroid disease in her maternal aunt.  ROS:   Please see the history of present illness.    All other systems reviewed and are negative.  EKGs/Labs/Other Studies Reviewed:    The following studies were reviewed today:  EKG:  NSR, borderline voltage for LVH  Recent Labs: 02/12/2020: ALT 65; Hemoglobin 14.5; Platelets 217 03/10/2020: BUN 9; Creat 0.74; Potassium 4.1; Sodium 138; TSH 1.68  Recent Lipid Panel    Component Value Date/Time   CHOL 231 (H) 02/12/2020 1551   TRIG 122 02/12/2020 1551   HDL 57 02/12/2020 1551   CHOLHDL 4.1 02/12/2020 1551   LDLCALC 152 (H) 02/12/2020 1551    Physical Exam:    VS:  BP (!) 166/112 (BP Location: Left Arm, Patient Position: Sitting, Cuff Size: Normal)   Pulse 80   Temp 97.9 F (36.6 C)   Ht 5\' 3"  (1.6 m)   Wt 169 lb (76.7 kg)  LMP 03/07/2020   BMI 29.94 kg/m     Wt Readings from Last 5 Encounters:  03/10/20 171 lb (77.6 kg)  02/12/20 170 lb (77.1 kg)    Constitutional: No acute distress Eyes: sclera non-icteric, normal conjunctiva and lids ENMT: normal dentition, moist mucous membranes Cardiovascular: regular rhythm, normal rate, no murmurs. S1 and S2 normal. Radial pulses normal bilaterally. No jugular venous distention.  Respiratory: clear to auscultation bilaterally GI : normal bowel sounds, soft and nontender. No distention.   MSK: extremities warm, well perfused. No edema.  NEURO: grossly nonfocal exam, moves all extremities. PSYCH: alert and oriented x 3, normal  mood and affect.   ASSESSMENT:    1. Hypertension, unspecified type   2. Chest pain of uncertain etiology    PLAN:    Hypertension, unspecified type - Plan: EKG 12-Lead, ECHOCARDIOGRAM COMPLETE, VAS US RENAL ARTERY DUPLEX, CANCELED: ECHOCARDIOGRAM COMPLETE  Chest pain of uncertain etiology - Plan: EKG 12-Lead, ECHOCARDIOGRAM COMPLETE, CANCELED: ECHOCARDIOGRAM COMPLETE  We will need an echocardiogram and renal ultrasound to begin workup for secondary causes of hypertension. Need to screen for RAS and coarctation of the aorta. EKG suggestive of borderline voltage for LVH, to be further clarified by echo.   Given desire for pregnancy, we will treat her hypertension as if she is currently pregnant as a safest strategy. Will start labetalol 100 mg BID, and review BP at next visit. Will likely transition amlodipine to nifedipine.   If chest pain continues despite adequate BP control, will consider further imaging for coronary origins and course and possible congenital or structural abnormalities.   Cherlynn Kaiser, MD [place]  [facility]    Medication Adjustments/Labs and Tests Ordered: Current medicines are reviewed at length with the patient today.  Concerns regarding medicines are outlined above.   Orders Placed This Encounter  Procedures  . EKG 12-Lead  . ECHOCARDIOGRAM COMPLETE  . VAS US RENAL ARTERY DUPLEX    Meds ordered this encounter  Medications  . labetalol (NORMODYNE) 100 MG tablet    Sig: Take 1 tablet (100 mg total) by mouth 2 (two) times daily.    Dispense:  30 tablet    Refill:  3    Patient Instructions  Medication Instructions:  START LABETALOL 100mg  TWICE DAILY *If you need a refill on your cardiac medications before your next appointment, please call your pharmacy*  Lab Work: None Ordered At This Time.  If you have labs (blood work) drawn today and your tests are completely normal, you will receive your results only by: Marland Kitchen MyChart Message (if you  have MyChart) OR . A paper copy in the mail If you have any lab test that is abnormal or we need to change your treatment, we will call you to review the results.  Testing/Procedures: Your physician has requested that you have an echocardiogram. Echocardiography is a painless test that uses sound waves to create images of your heart. It provides your doctor with information about the size and shape of your heart and how well your heart's chambers and valves are working. You may receive an ultrasound enhancing agent through an IV if needed to better visualize your heart during the echo.This procedure takes approximately one hour. There are no restrictions for this procedure. This will take place at the [address], [address].   Your physician has requested that you have a renal artery duplex. During this test, an ultrasound is used to evaluate blood flow to the kidneys. Allow one  hour for this exam. Do not eat after midnight the day before and avoid carbonated beverages. Take your medications as you usually do.  YOU ARE BEING REFERRED TO [facility]) FOR HYPERTENSION CLINIC. PLEASE FOLLOW UP WITH THEM IN 1 MONTH.   Follow-Up: At [facility], you and your health needs are our priority.  As part of our continuing mission to provide you with exceptional heart care, we have created designated Provider Care Teams.  These Care Teams include your primary Cardiologist (physician) and Advanced Practice Providers (APPs -  Physician Assistants and Nurse Practitioners) who all work together to provide you with the care you need, when you need it.  Your next appointment:   3 month(s)  The format for your next appointment:   In Person  Provider:   Cherlynn Kaiser, MD

## 2020-03-31 DIAGNOSIS — Z1231 Encounter for screening mammogram for malignant neoplasm of breast: Secondary | ICD-10-CM

## 2020-04-02 ENCOUNTER — Ambulatory Visit (HOSPITAL_COMMUNITY): Payer: PRIVATE HEALTH INSURANCE | Attending: Cardiology

## 2020-04-02 ENCOUNTER — Other Ambulatory Visit: Payer: Self-pay

## 2020-04-02 ENCOUNTER — Other Ambulatory Visit: Payer: Self-pay | Admitting: Physician Assistant

## 2020-04-02 DIAGNOSIS — I10 Essential (primary) hypertension: Secondary | ICD-10-CM | POA: Diagnosis present

## 2020-04-02 DIAGNOSIS — R079 Chest pain, unspecified: Secondary | ICD-10-CM | POA: Diagnosis present

## 2020-04-02 LAB — ECHOCARDIOGRAM COMPLETE
Area-P 1/2: 3.21 cm2
S' Lateral: 2.9 cm

## 2020-04-02 NOTE — Progress Notes (Signed)
Patient ID: Elizabeth Morales, female   DOB: 11/30/1990, 30 y.o.   MRN: [id number]  Per order, did not administer Definity.

## 2020-04-06 ENCOUNTER — Ambulatory Visit (HOSPITAL_COMMUNITY)
Admission: RE | Admit: 2020-04-06 | Discharge: 2020-04-06 | Disposition: A | Discharge status: Home / Self Care | Payer: PRIVATE HEALTH INSURANCE | Source: Ambulatory Visit | Attending: Cardiovascular Disease | Admitting: Cardiovascular Disease

## 2020-04-06 ENCOUNTER — Other Ambulatory Visit: Payer: Self-pay

## 2020-04-06 ENCOUNTER — Encounter: Payer: Self-pay | Admitting: *Deleted

## 2020-04-06 DIAGNOSIS — I10 Essential (primary) hypertension: Secondary | ICD-10-CM | POA: Insufficient documentation

## 2020-04-07 ENCOUNTER — Other Ambulatory Visit: Payer: Self-pay

## 2020-04-07 MED ORDER — LABETALOL HCL 100 MG PO TABS: 100.0000 mg | ORAL_TABLET | Freq: Two times a day (BID) | ORAL | 6 refills | Status: AC

## 2020-04-08 ENCOUNTER — Encounter: Payer: Self-pay | Admitting: Physician Assistant

## 2020-04-08 ENCOUNTER — Ambulatory Visit
Admission: RE | Admit: 2020-04-08 | Discharge: 2020-04-08 | Disposition: A | Discharge status: Home / Self Care | Payer: PRIVATE HEALTH INSURANCE | Source: Ambulatory Visit | Attending: Obstetrics and Gynecology | Admitting: Obstetrics and Gynecology

## 2020-04-08 DIAGNOSIS — N979 Female infertility, unspecified: Secondary | ICD-10-CM

## 2020-04-09 ENCOUNTER — Telehealth: Payer: Self-pay

## 2020-04-09 DIAGNOSIS — N979 Female infertility, unspecified: Secondary | ICD-10-CM

## 2020-04-09 MED ORDER — DOXYCYCLINE HYCLATE 100 MG PO CAPS: 100.0000 mg | ORAL_CAPSULE | Freq: Two times a day (BID) | ORAL | 0 refills | Status: AC

## 2020-04-09 NOTE — Telephone Encounter (Signed)
-----   Message from Salvadore Dom, MD sent at 04/08/2020  9:51 PM EDT ----- Please let the patient know that the sonohysterogram did show blockage of her tubes as well as a possible fibroid. Please place referral to Dr Kerin Perna.  Please call in doxycycline 100 mg po BID x 5 days secondary to the tubal blockage.

## 2020-04-09 NOTE — Telephone Encounter (Signed)
Spoke with pt. Pt given results and recommendations per Dr Talbert Nan. Pt agreeable to Rx and Referral. Pt verbalized understanding.  Rx sent to pharmacy on file.  Referral placed  Encounter closed  Cc: Hayley for referral

## 2020-05-03 ENCOUNTER — Encounter: Payer: Self-pay | Admitting: Obstetrics and Gynecology

## 2020-05-04 ENCOUNTER — Encounter: Payer: Self-pay | Admitting: Physician Assistant

## 2020-05-07 ENCOUNTER — Telehealth: Payer: Self-pay

## 2020-05-07 NOTE — Telephone Encounter (Signed)
Routing to Dr Talbert Nan, Please advise   Pt refused referral to Dr Kerin Perna due to out of network with insurance.

## 2020-05-07 NOTE — Telephone Encounter (Signed)
Pt sent the following mychart message: [place], [facility] .. hope you had a great thanksgiving... Soo how would I know if the medicine worked to hopefully open my tubes .. should we schedule an ultrasound or hoe would I know

## 2020-05-08 NOTE — Telephone Encounter (Signed)
Please let the patient know that the doxycycline won't unblock her tubes, it should just prevent reactivation of an infection.  Please place referral to infertility specialist in her network, she will likely need surgery or IVF secondary to tubal blockage.

## 2020-05-10 NOTE — Telephone Encounter (Signed)
Spoke with pt. Pt given update on Rx for doxycycline per Dr Talbert Nan. Pt verbalized understanding.   Pt agreeable to have new infertility referral with insurance. Advised will see who is in-network and return call. Pt agreeable.

## 2020-05-13 ENCOUNTER — Ambulatory Visit: Payer: PRIVATE HEALTH INSURANCE

## 2020-05-18 NOTE — Telephone Encounter (Signed)
Call placed to pt. Spoke with pt. Pt updated on [employer] in-network providers for current insurance plan. Pt advised per [employer] to change policy to [place] from [place] and Dr Kerin Perna will be in-network. Pt verbalized understanding. Pt to return call to our office when changed to have new referral placed. Pt agreeable.  Thankful for help.  Encounter closed

## 2020-06-10 ENCOUNTER — Ambulatory Visit: Payer: PRIVATE HEALTH INSURANCE

## 2020-06-23 ENCOUNTER — Ambulatory Visit: Payer: PRIVATE HEALTH INSURANCE | Admitting: Internal Medicine

## 2021-09-13 IMAGING — RF DG HYSTEROGRAM
1 series · 7 of 7 positions shown · IV contrast (omnipaque)
Comparison: None.

CLINICAL DATA: Primary female infertility evaluation.

EXAM:
HYSTEROSALPINGOGRAM
TECHNIQUE: Following cleansing of the cervix and vagina with Betadine solution,
a hysterosalpingogram was performed using a 5-French
hysterosalpingogram catheter and Omnipaque 300 contrast. The patient
tolerated the examination without difficulty.

[Series 1: one shot · 7 of 7 slices shown]
[im 1/7]
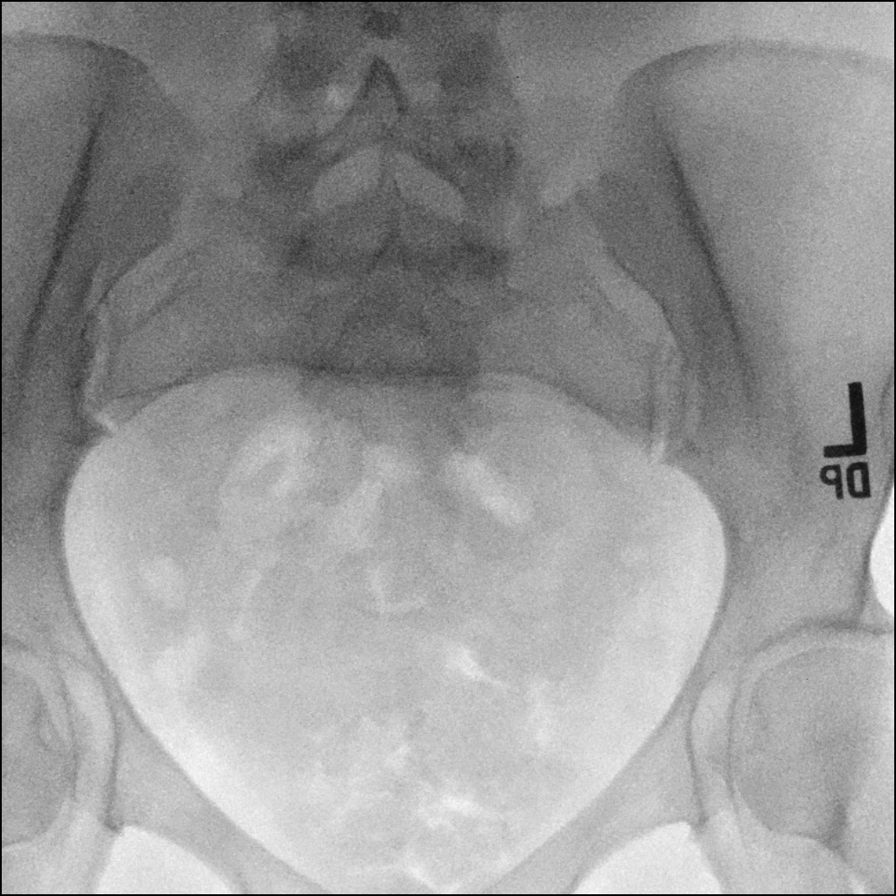
[im 2/7]
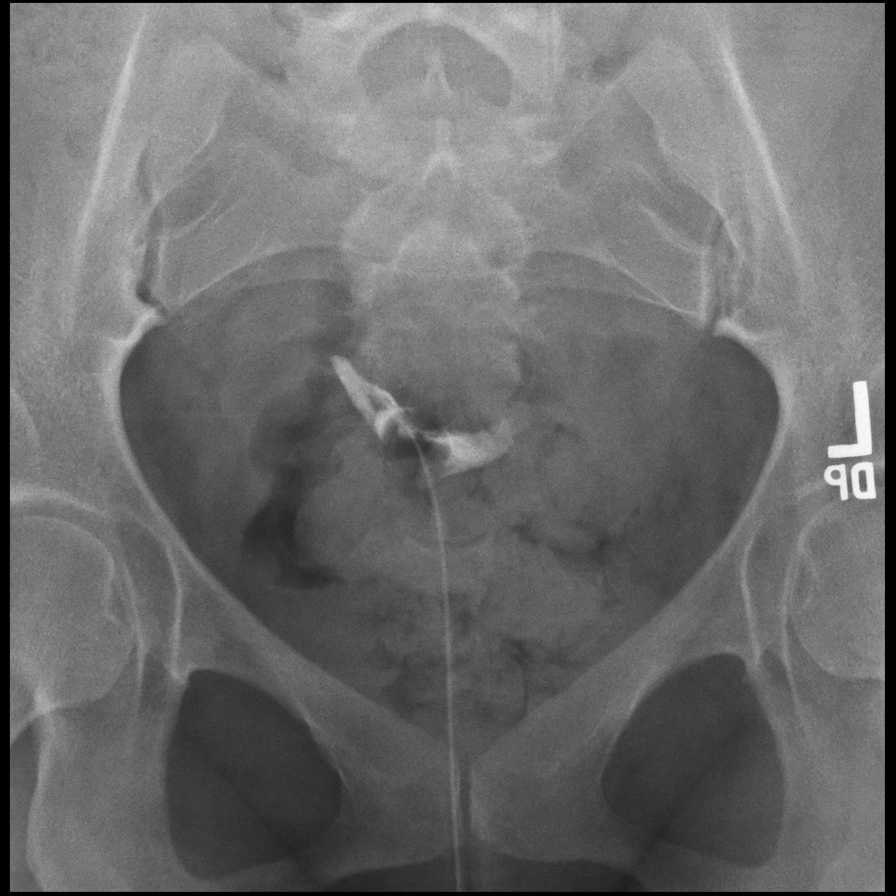
[im 3/7]
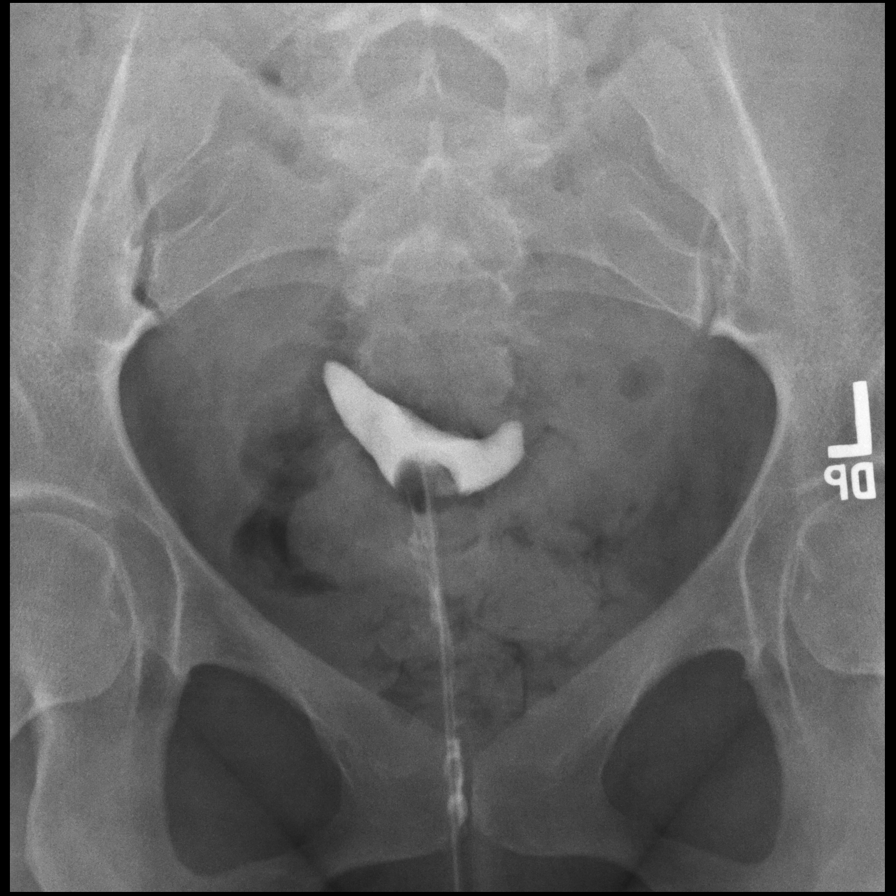
[im 4/7]
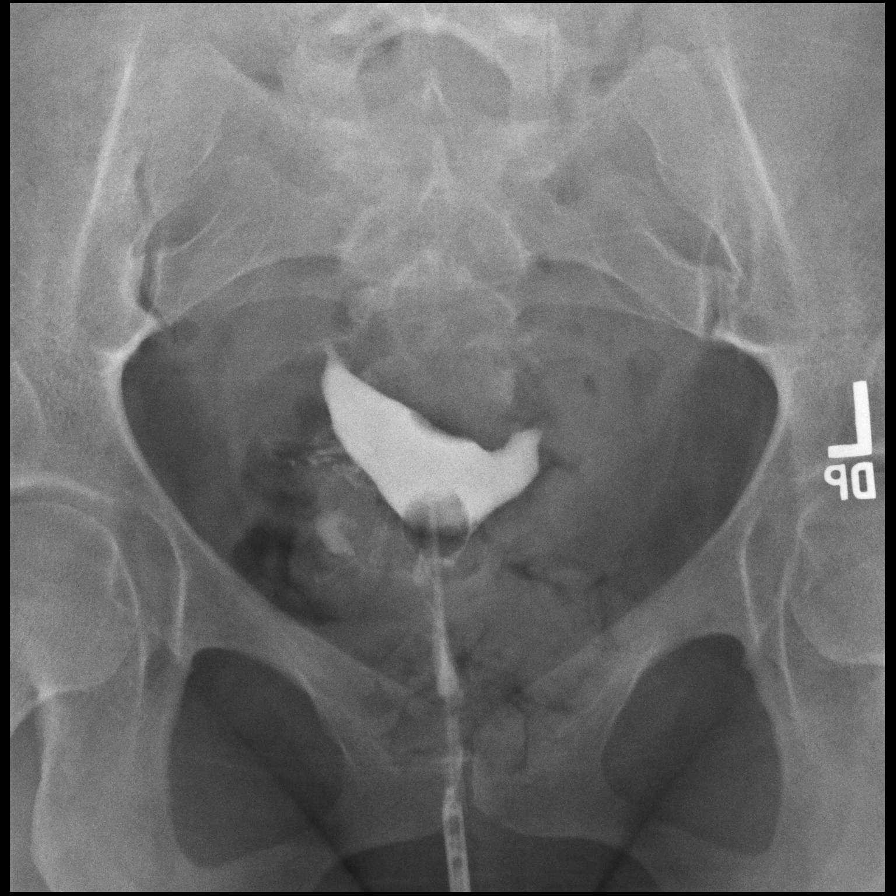
[im 5/7]
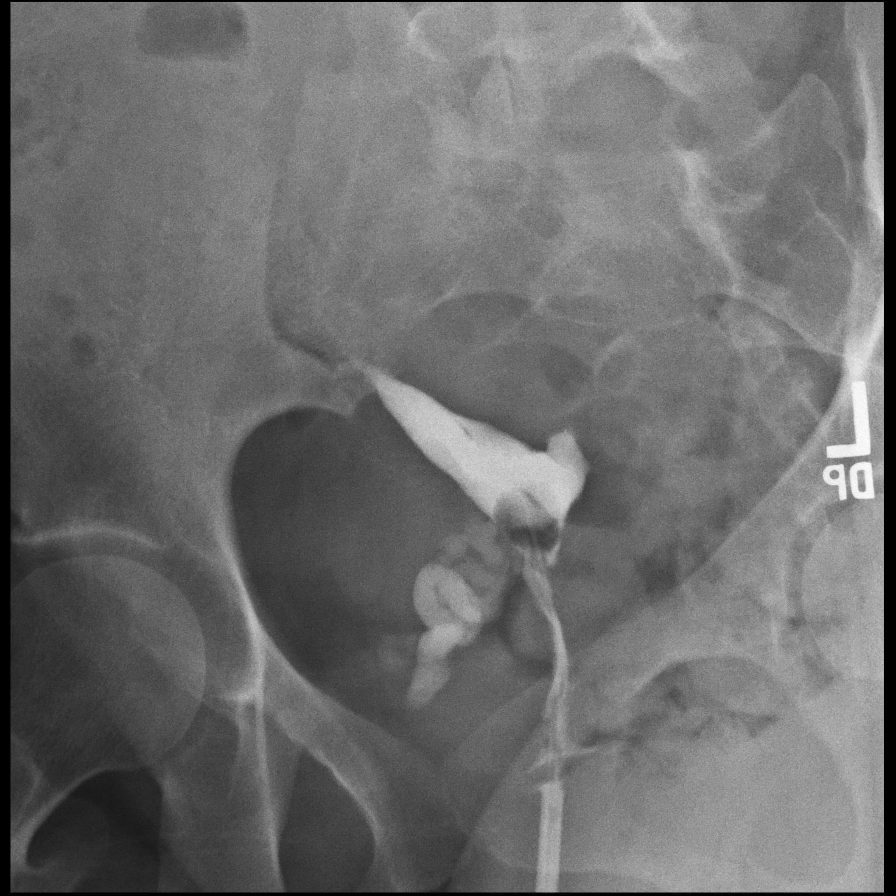
[im 6/7]
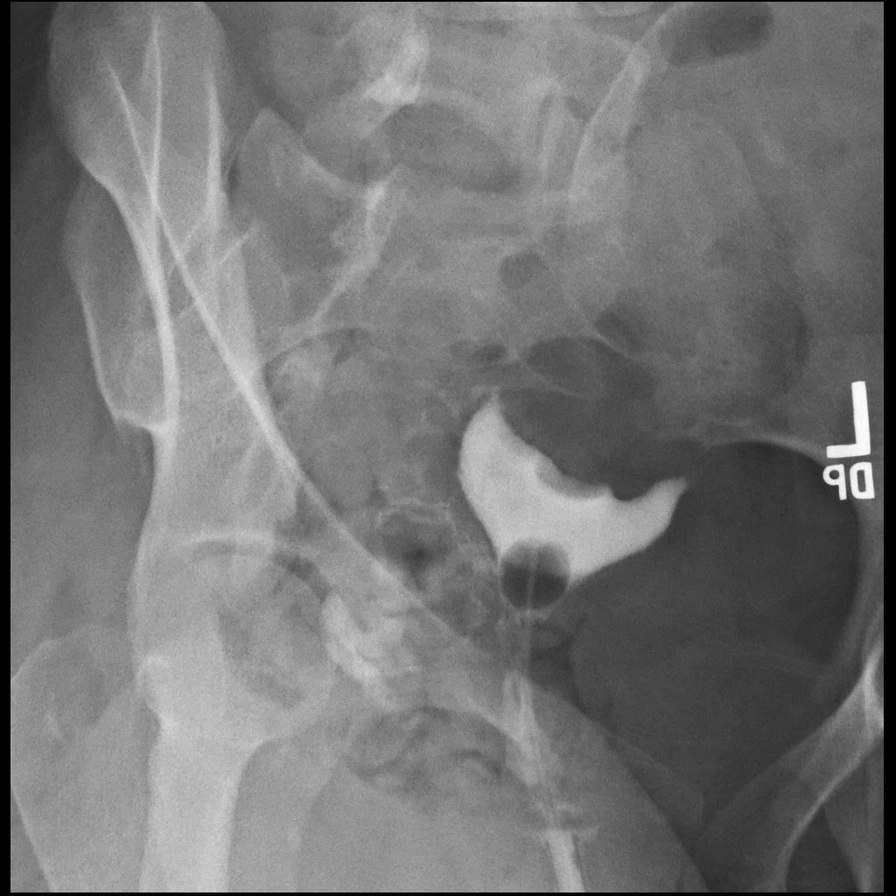
[im 7/7]
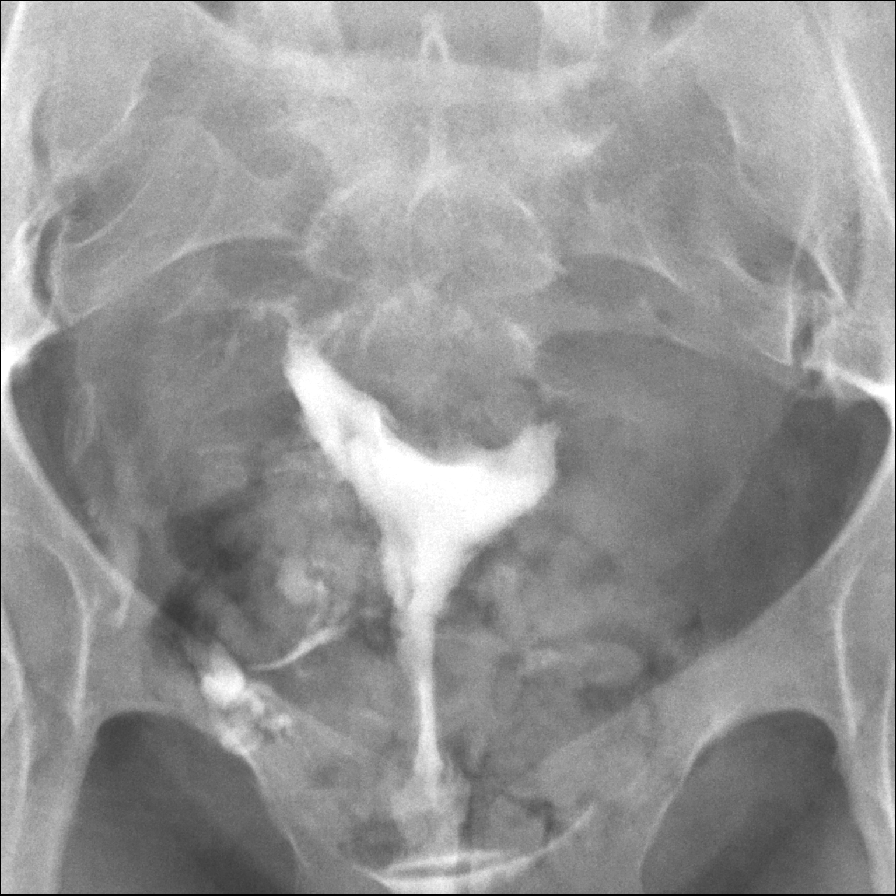

[7 of 7 positions shown; findings below may reference images not displayed]

FLUOROSCOPY TIME:  Radiation Exposure Index (as provided by the
fluoroscopic device): 119 mGy

Fluoroscopy Time:  1 minutes 24 seconds

Number of Acquired Images:  6
FINDINGS: There was non opacification of the fallopian tubes bilaterally,
compatible with proximal tubal occlusion bilaterally.

Lobulated contour of the left fundal uterine cavity, cannot exclude
submucosal fibroids. Myometrial contrast intravasation was observed
in the right uterine body.
IMPRESSION: 1. Bilateral proximal tubal occlusion.
2. Lobulated left fundal uterine cavity contour, cannot exclude
submucosal fibroids. Suggest pelvic ultrasound correlation.

## 2022-02-27 ENCOUNTER — Encounter: Payer: Self-pay | Admitting: *Deleted

## 2022-05-18 ENCOUNTER — Encounter: Payer: Self-pay | Admitting: *Deleted
